# Patient Record
Sex: Male | Born: 1965 | Race: White | Marital: Single | Smoking: Never smoker
Health system: Southern US, Community
[De-identification: ages and names within clinical notes are randomized; demographics above are authoritative.]

---

## 2007-01-02 ENCOUNTER — Ambulatory Visit: Payer: Self-pay | Admitting: Family Medicine

## 2008-09-12 ENCOUNTER — Ambulatory Visit: Payer: Self-pay | Admitting: Internal Medicine

## 2011-03-30 ENCOUNTER — Ambulatory Visit
Admission: RE | Admit: 2011-03-30 | Discharge: 2011-03-30 | Disposition: A | Discharge status: Home / Self Care | Payer: 59 | Source: Ambulatory Visit | Attending: Internal Medicine | Admitting: Internal Medicine

## 2011-03-30 ENCOUNTER — Other Ambulatory Visit: Payer: Self-pay | Admitting: Internal Medicine

## 2011-03-30 DIAGNOSIS — N289 Disorder of kidney and ureter, unspecified: Secondary | ICD-10-CM

## 2011-03-31 ENCOUNTER — Other Ambulatory Visit: Payer: Self-pay

## 2011-04-02 ENCOUNTER — Other Ambulatory Visit: Payer: Self-pay | Admitting: Internal Medicine

## 2011-04-02 DIAGNOSIS — N289 Disorder of kidney and ureter, unspecified: Secondary | ICD-10-CM

## 2011-04-03 ENCOUNTER — Ambulatory Visit
Admission: RE | Admit: 2011-04-03 | Discharge: 2011-04-03 | Disposition: A | Discharge status: Home / Self Care | Payer: 59 | Source: Ambulatory Visit | Attending: Internal Medicine | Admitting: Internal Medicine

## 2011-04-03 DIAGNOSIS — N289 Disorder of kidney and ureter, unspecified: Secondary | ICD-10-CM

## 2011-04-03 MED ORDER — GADOBENATE DIMEGLUMINE 529 MG/ML IV SOLN
17.0000 mL | Freq: Once | INTRAVENOUS | Status: AC | PRN
  Administered 2011-04-03: 17 mL via INTRAVENOUS

## 2011-06-24 ENCOUNTER — Other Ambulatory Visit: Payer: Self-pay | Admitting: Urology

## 2011-08-03 ENCOUNTER — Ambulatory Visit (HOSPITAL_COMMUNITY)
Admission: RE | Admit: 2011-08-03 | Discharge: 2011-08-03 | Disposition: A | Discharge status: Home / Self Care | Payer: 59 | Source: Ambulatory Visit | Attending: Urology | Admitting: Urology

## 2011-08-03 DIAGNOSIS — N289 Disorder of kidney and ureter, unspecified: Secondary | ICD-10-CM | POA: Insufficient documentation

## 2011-08-03 DIAGNOSIS — Q619 Cystic kidney disease, unspecified: Secondary | ICD-10-CM | POA: Insufficient documentation

## 2011-08-03 DIAGNOSIS — R1032 Left lower quadrant pain: Secondary | ICD-10-CM | POA: Insufficient documentation

## 2011-08-03 MED ORDER — GADOBENATE DIMEGLUMINE 529 MG/ML IV SOLN
15.0000 mL | Freq: Once | INTRAVENOUS | Status: AC | PRN
  Administered 2011-08-03: 17 mL via INTRAVENOUS

## 2013-07-27 ENCOUNTER — Other Ambulatory Visit: Payer: Self-pay | Admitting: Urology

## 2013-07-27 DIAGNOSIS — Q6102 Congenital multiple renal cysts: Secondary | ICD-10-CM

## 2013-08-09 ENCOUNTER — Ambulatory Visit (HOSPITAL_COMMUNITY)
Admission: RE | Admit: 2013-08-09 | Discharge: 2013-08-09 | Disposition: A | Discharge status: Home / Self Care | Payer: BC Managed Care – PPO | Source: Ambulatory Visit | Attending: Urology | Admitting: Urology

## 2013-08-09 DIAGNOSIS — N289 Disorder of kidney and ureter, unspecified: Secondary | ICD-10-CM | POA: Insufficient documentation

## 2013-08-09 DIAGNOSIS — R109 Unspecified abdominal pain: Secondary | ICD-10-CM | POA: Insufficient documentation

## 2013-08-09 DIAGNOSIS — Q6102 Congenital multiple renal cysts: Secondary | ICD-10-CM

## 2013-08-09 MED ORDER — GADOBENATE DIMEGLUMINE 529 MG/ML IV SOLN
20.0000 mL | Freq: Once | INTRAVENOUS | Status: AC | PRN
  Administered 2013-08-09: 18 mL via INTRAVENOUS

## 2014-07-30 ENCOUNTER — Other Ambulatory Visit (HOSPITAL_COMMUNITY): Payer: Self-pay | Admitting: Urology

## 2014-07-30 DIAGNOSIS — C649 Malignant neoplasm of unspecified kidney, except renal pelvis: Secondary | ICD-10-CM

## 2014-08-10 ENCOUNTER — Ambulatory Visit (HOSPITAL_COMMUNITY)
Admission: RE | Admit: 2014-08-10 | Discharge: 2014-08-10 | Disposition: A | Discharge status: Home / Self Care | Payer: BLUE CROSS/BLUE SHIELD | Source: Ambulatory Visit | Attending: Urology | Admitting: Urology

## 2014-08-10 DIAGNOSIS — N289 Disorder of kidney and ureter, unspecified: Secondary | ICD-10-CM | POA: Insufficient documentation

## 2014-08-10 DIAGNOSIS — C649 Malignant neoplasm of unspecified kidney, except renal pelvis: Secondary | ICD-10-CM

## 2014-08-10 MED ORDER — GADOBENATE DIMEGLUMINE 529 MG/ML IV SOLN
20.0000 mL | Freq: Once | INTRAVENOUS | Status: AC | PRN
  Administered 2014-08-10: 18 mL via INTRAVENOUS

## 2015-05-27 ENCOUNTER — Encounter: Payer: Self-pay | Admitting: Emergency Medicine

## 2015-05-27 ENCOUNTER — Emergency Department
Admission: EM | Admit: 2015-05-27 | Discharge: 2015-05-27 | Disposition: A | Discharge status: Home / Self Care | Payer: Managed Care, Other (non HMO) | Attending: Emergency Medicine | Admitting: Emergency Medicine

## 2015-05-27 ENCOUNTER — Emergency Department: Payer: Managed Care, Other (non HMO)

## 2015-05-27 DIAGNOSIS — M25511 Pain in right shoulder: Secondary | ICD-10-CM | POA: Insufficient documentation

## 2015-05-27 MED ORDER — TRAMADOL HCL 50 MG PO TABS: 50.0000 mg | ORAL_TABLET | Freq: Four times a day (QID) | ORAL | Status: AC | PRN

## 2015-05-27 MED ORDER — PREDNISONE 20 MG PO TABS
40.0000 mg | ORAL_TABLET | Freq: Once | ORAL | Status: AC
  Administered 2015-05-27: 40 mg via ORAL
  Filled 2015-05-27: qty 2

## 2015-05-27 MED ORDER — PREDNISONE 20 MG PO TABS: 40.0000 mg | ORAL_TABLET | Freq: Every day | ORAL | Status: AC

## 2015-05-27 MED ORDER — HYDROCODONE-ACETAMINOPHEN 5-325 MG PO TABS: 1.0000 | ORAL_TABLET | ORAL | Status: AC | PRN

## 2015-05-27 MED ORDER — KETOROLAC TROMETHAMINE 60 MG/2ML IM SOLN
60.0000 mg | Freq: Once | INTRAMUSCULAR | Status: AC
  Administered 2015-05-27: 60 mg via INTRAMUSCULAR
  Filled 2015-05-27: qty 2

## 2015-05-27 NOTE — ED Notes (Addendum)
Pt c/o right shoulder pain for several days; worsening; stabbing pain; says he is unable to lift his arm due to the pain; pt denies injury; pain started Friday and has worsened over the weekend; pt says pain is keeping him awake tonight

## 2015-05-27 NOTE — ED Provider Notes (Signed)
[facility] Emergency Department Provider Note  Time seen: 4:43 AM  I have reviewed the triage vital signs and the nursing notes.   HISTORY  Chief Complaint Shoulder Pain    HPI William Schultz is a 50 y.o. male with no past medical history who presents to the emergency department right shoulder pain. According to the patient for the past 2 days he has been experiencing right shoulder pain. States this started Friday morning when he woke up. States he had a dull aching pain in the right shoulder as if he slept on it wrong or pulled it. Patient states over the past 2 days it has worsened, and is now painful to move the right shoulder. Denies any history of arthritis or gout. Denies any fever, nausea or vomiting. Denies any known trauma to the shoulder.     History reviewed. No pertinent past medical history.  There are no active problems to display for this patient.   History reviewed. No pertinent past surgical history.  No current outpatient prescriptions on file.  Allergies Review of patient's allergies indicates no known allergies.  History reviewed. No pertinent family history.  Social History Social History  Substance Use Topics  . Smoking status: Never Smoker   . Smokeless tobacco: None  . Alcohol Use: Yes     Comment: occasional    Review of Systems Constitutional: Negative for fever. Cardiovascular: Negative for chest pain. Respiratory: Negative for shortness of breath. Gastrointestinal: Negative for abdominal pain.  negative for vomiting. Musculoskeletal: Positive for right shoulder pain. Skin: Negative for rash. Neurological: Negative for headache 10-point ROS otherwise negative.  ____________________________________________   PHYSICAL EXAM:  VITAL SIGNS: ED Triage Vitals  Enc Vitals Group     BP 05/27/15 0224 179/102 mmHg     Pulse Rate 05/27/15 0224 89     Resp 05/27/15 0224 18     Temp 05/27/15 0224 98.1 F (36.7 C)      Temp Source 05/27/15 0224 Oral     SpO2 05/27/15 0224 97 %     Weight 05/27/15 0224 200 lb (90.719 kg)     Height 05/27/15 0224 5\' 9"  (1.753 m)     Head Cir --      Peak Flow --      Pain Score 05/27/15 0221 7     Pain Loc --      Pain Edu? --      Excl. in [place]? --     Constitutional: Alert and oriented. Well appearing and in no distress. Eyes: Normal exam ENT   Head: Normocephalic and atraumatic   Mouth/Throat: Mucous membranes are moist. Cardiovascular: Normal rate, regular rhythm. No murmur Respiratory: Normal respiratory effort without tachypnea nor retractions. Breath sounds are clear Gastrointestinal: Soft and nontender. No distention.   Musculoskeletal: Moderate tenderness to palpation of the right shoulder. Moderate tenderness with active and passive range of motion of the shoulder. Neurovascular intact distally, 2+ radial pulse. No skin color changes or erythema. No appreciable edema. Neurologic:  Normal speech and language. No gross focal neurologic deficits Skin:  Skin is warm, dry and intact.  Psychiatric: Mood and affect are normal.   ____________________________________________   RADIOLOGY  X-ray negative  ____________________________________________    INITIAL IMPRESSION / ASSESSMENT AND PLAN / ED COURSE  Pertinent labs & imaging results that were available during my care of the patient were reviewed by me and considered in my medical decision making (see chart for details).  X-rays negative. Patient's shoulder is  tender to palpation and range of motion, but no overlying skin color changes, no appreciable swelling, denies fever or vomiting. Do not suspect septic joint. Likely musculoskeletal pain or purse sinus. I discussed with the patient if he develops any fever or worsening pain he is to return to the emergency department for further evaluation, otherwise the patient will follow up with orthopedics in 1 week if symptoms remain. I will place  patient on a burst course of steroids as well as pain medication to be taken as needed. Patient is agreeable to this plan. We will dose IM Toradol in the emergency department along with his first dose of steroids. Patient seems to be a reliable patient, and agrees to return for any worsening symptoms or fever.  ____________________________________________   FINAL CLINICAL IMPRESSION(S) / ED DIAGNOSES  Right shoulder pain   Harvest Dark, MD 05/27/15 [phone]

## 2015-05-27 NOTE — Discharge Instructions (Signed)

## 2016-04-08 ENCOUNTER — Other Ambulatory Visit (HOSPITAL_COMMUNITY): Payer: Self-pay | Admitting: Urology

## 2016-04-08 DIAGNOSIS — C649 Malignant neoplasm of unspecified kidney, except renal pelvis: Secondary | ICD-10-CM

## 2016-04-21 ENCOUNTER — Ambulatory Visit (HOSPITAL_COMMUNITY)
Admission: RE | Admit: 2016-04-21 | Discharge: 2016-04-21 | Disposition: A | Discharge status: Home / Self Care | Payer: Managed Care, Other (non HMO) | Source: Ambulatory Visit | Attending: Urology | Admitting: Urology

## 2016-04-21 DIAGNOSIS — N281 Cyst of kidney, acquired: Secondary | ICD-10-CM | POA: Diagnosis not present

## 2016-04-21 DIAGNOSIS — K760 Fatty (change of) liver, not elsewhere classified: Secondary | ICD-10-CM | POA: Insufficient documentation

## 2016-04-21 DIAGNOSIS — C649 Malignant neoplasm of unspecified kidney, except renal pelvis: Secondary | ICD-10-CM | POA: Insufficient documentation

## 2016-04-21 MED ORDER — GADOBENATE DIMEGLUMINE 529 MG/ML IV SOLN
20.0000 mL | Freq: Once | INTRAVENOUS | Status: AC | PRN
  Administered 2016-04-21: 19 mL via INTRAVENOUS

## 2017-05-19 IMAGING — CR DG SHOULDER 2+V*R*
3 series · 3 of 3 positions shown · non-contrast
Comparison: None.

CLINICAL DATA: RIGHT shoulder pain without injury for 3 days.

EXAM:
RIGHT SHOULDER - 2+ VIEW

[shoulder grashey]
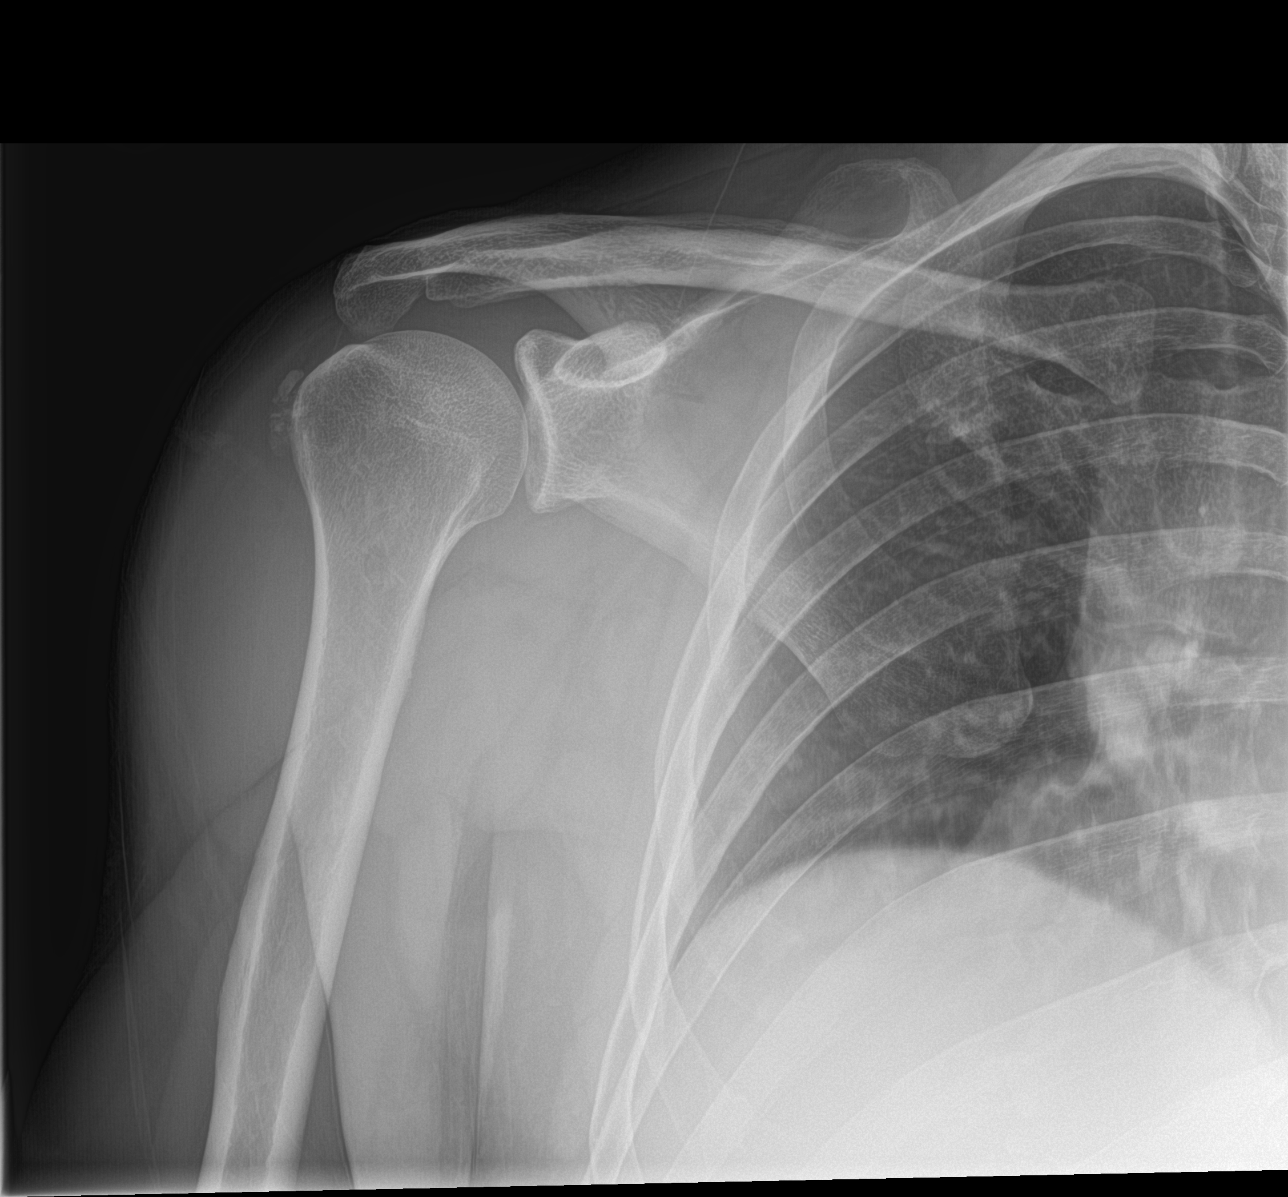

[shoulder y view]
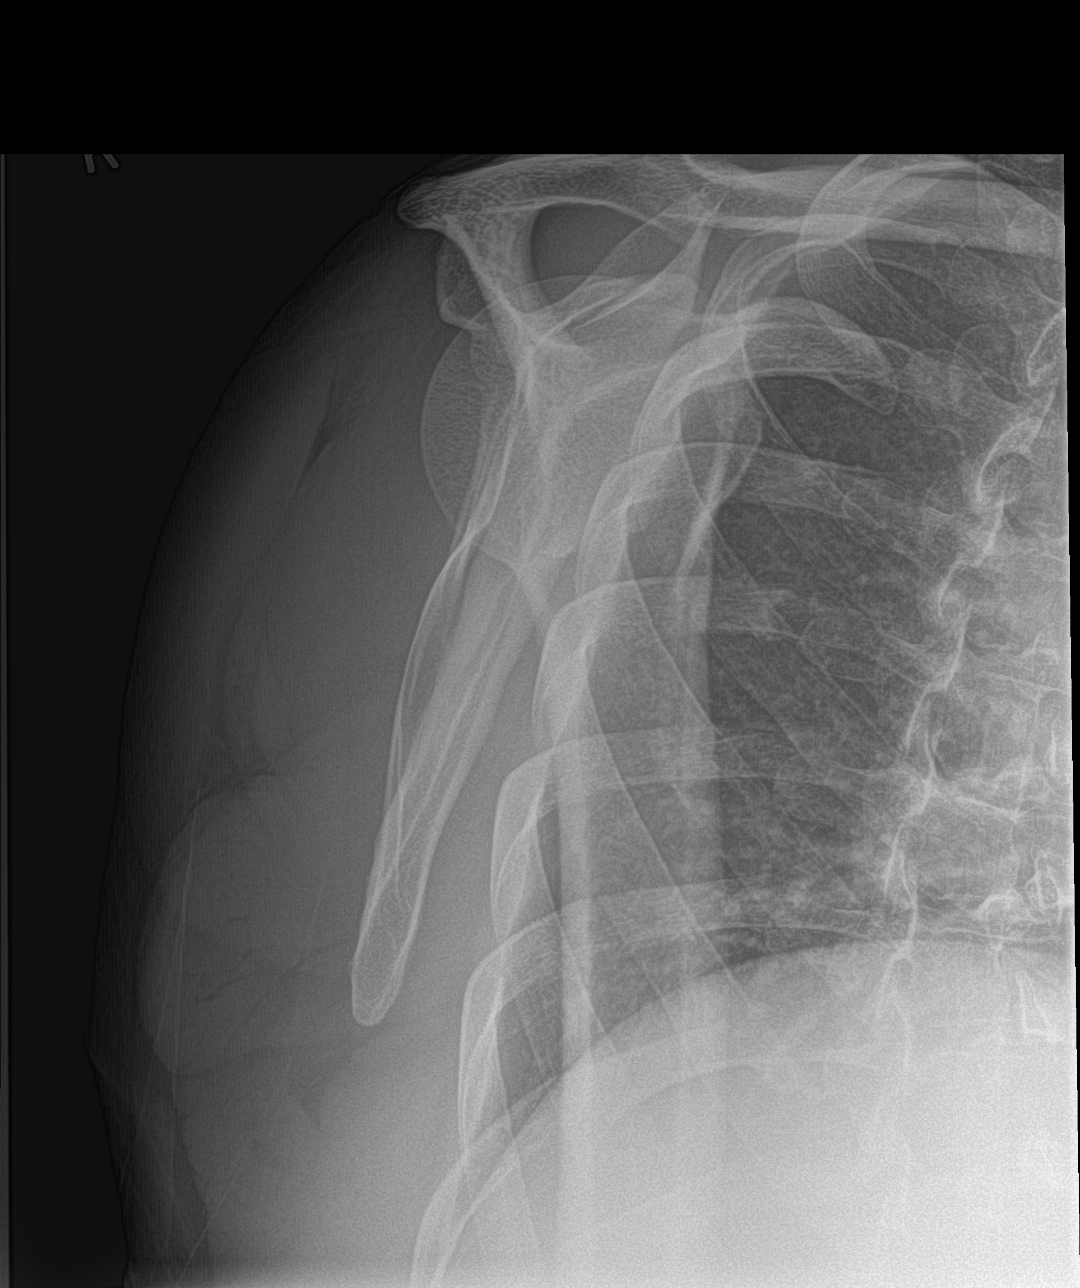

[shoulder axillary]
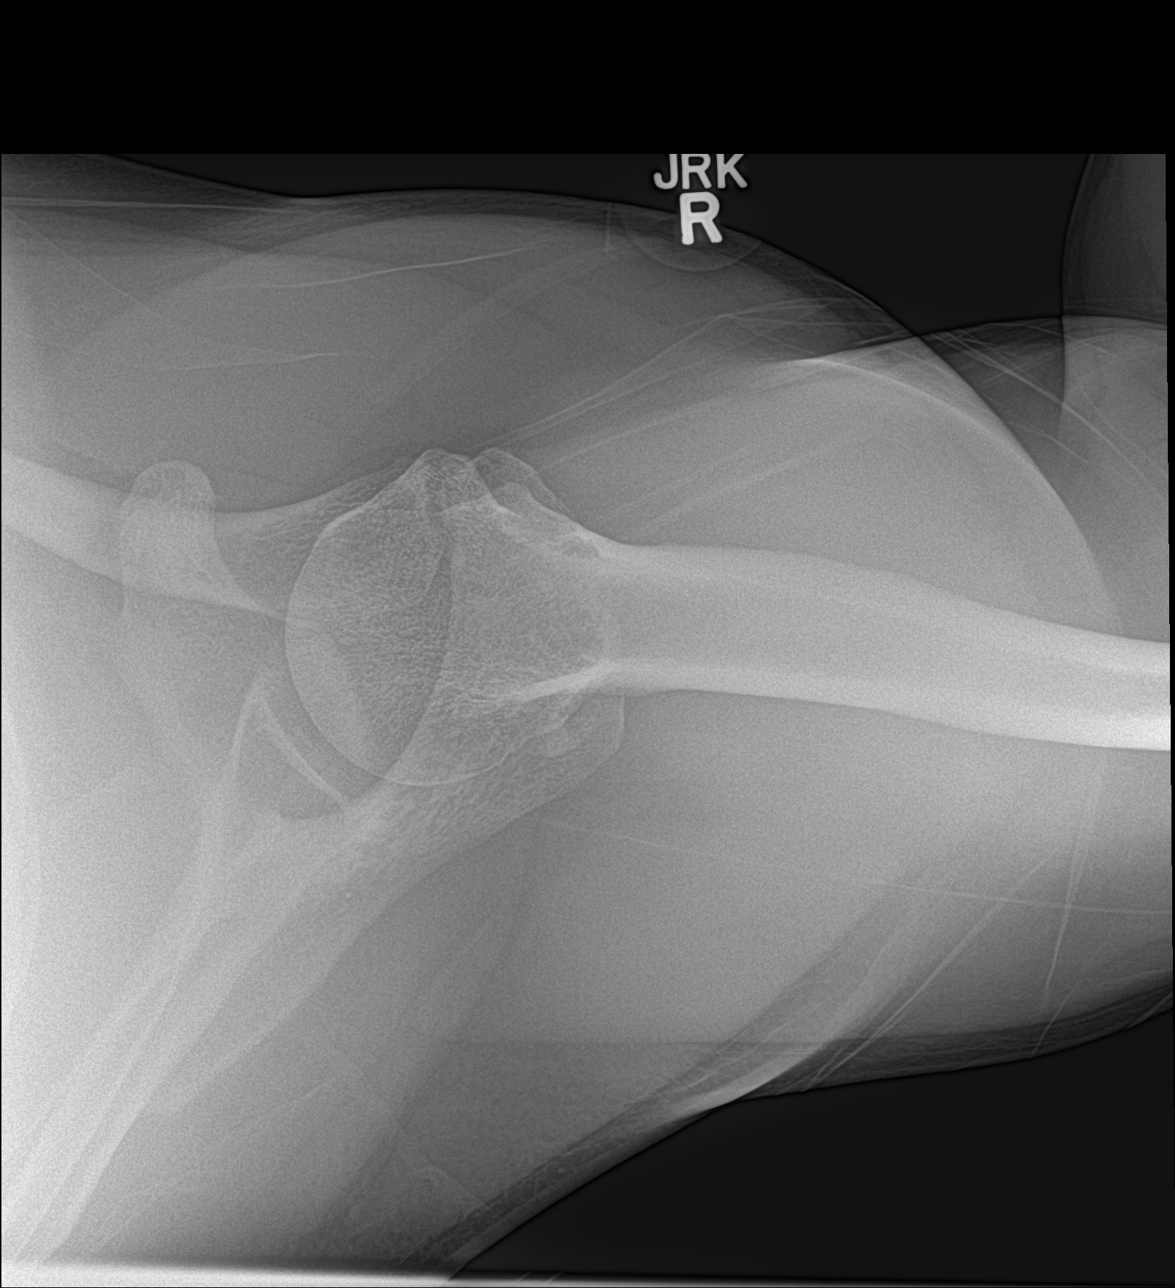

[3 of 3 positions shown; findings below may reference images not displayed]

FINDINGS: There is no evidence of fracture or dislocation. Corticated
calcifications lateral to the humeral head at the level of the
supraspinatus insertion. There is no evidence of arthropathy or
other focal bone abnormality. Soft tissues are unremarkable.
IMPRESSION: Calcific tendinopathy without acute osseous process.

## 2018-06-13 ENCOUNTER — Other Ambulatory Visit: Payer: Self-pay | Admitting: Urology

## 2018-06-13 DIAGNOSIS — D49512 Neoplasm of unspecified behavior of left kidney: Secondary | ICD-10-CM

## 2018-06-27 ENCOUNTER — Ambulatory Visit (HOSPITAL_COMMUNITY)
Admission: RE | Admit: 2018-06-27 | Discharge: 2018-06-27 | Disposition: A | Discharge status: Home / Self Care | Payer: BC Managed Care – PPO | Source: Ambulatory Visit | Attending: Urology | Admitting: Urology

## 2018-06-27 ENCOUNTER — Other Ambulatory Visit: Payer: Self-pay

## 2018-06-27 DIAGNOSIS — D49512 Neoplasm of unspecified behavior of left kidney: Secondary | ICD-10-CM | POA: Diagnosis not present

## 2018-06-27 MED ORDER — GADOBUTROL 1 MMOL/ML IV SOLN
9.0000 mL | Freq: Once | INTRAVENOUS | Status: AC | PRN
Start: 2018-06-27 — End: 2018-06-27
  Administered 2018-06-27: 9 mL via INTRAVENOUS

## 2020-06-04 ENCOUNTER — Other Ambulatory Visit: Payer: Self-pay | Admitting: Urology

## 2020-06-04 DIAGNOSIS — D49512 Neoplasm of unspecified behavior of left kidney: Secondary | ICD-10-CM

## 2020-06-19 ENCOUNTER — Other Ambulatory Visit: Payer: Self-pay

## 2020-06-19 ENCOUNTER — Ambulatory Visit (HOSPITAL_COMMUNITY)
Admission: RE | Admit: 2020-06-19 | Discharge: 2020-06-19 | Disposition: A | Discharge status: Home / Self Care | Payer: BC Managed Care – PPO | Source: Ambulatory Visit | Attending: Urology | Admitting: Urology

## 2020-06-19 DIAGNOSIS — D49512 Neoplasm of unspecified behavior of left kidney: Secondary | ICD-10-CM | POA: Insufficient documentation

## 2020-06-19 IMAGING — MR MRI ABDOMEN WITH AND WITHOUT CONTRAST
11 of 19 series · 22 of 48 positions shown · IV contrast (gadavist)
Comparison: 04/21/2016 and 08/10/2014

CLINICAL DATA: Follow-up left renal mass.

EXAM:
MRI ABDOMEN WITHOUT AND WITH CONTRAST
TECHNIQUE: Multiplanar multisequence MR imaging of the abdomen was performed
both before and after the administration of intravenous contrast.
CONTRAST:  9 mL Gadavist

[Series 3: DWI b500 · axial · 6.0mm · 1.72mm/px · z∈[-41,+287]mm · 2 of 86 slices shown (1 of 2)]
[im 1/86]
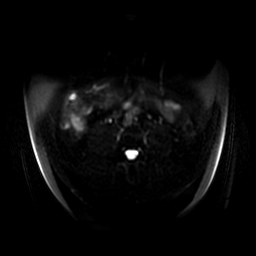
[im 86/86]
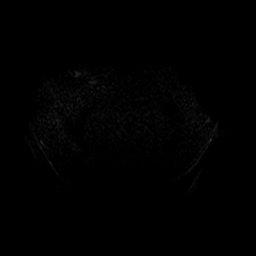

[Series 5: DWI b500 · axial · 6.0mm · 1.72mm/px · z∈[-64,+287]mm · 2 of 92 slices shown (2 of 2)]
[im 1/92]
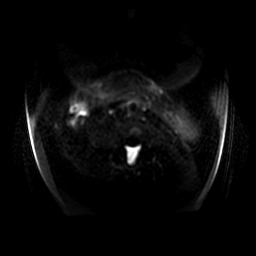
[im 92/92]
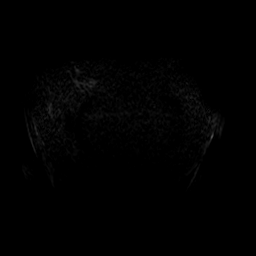

[Series 6: T2 fat-sat · axial · 5.0mm · 0.86mm/px · 1 of 62 slices shown]
[im 1/62]
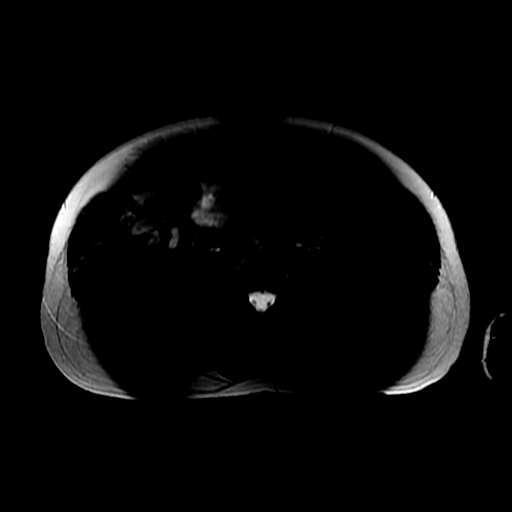

[Series 7: T2 · axial · 5.0mm · 0.86mm/px · z∈[-37,+268]mm · 2 of 62 slices shown (1 of 2)]
[im 1/62]
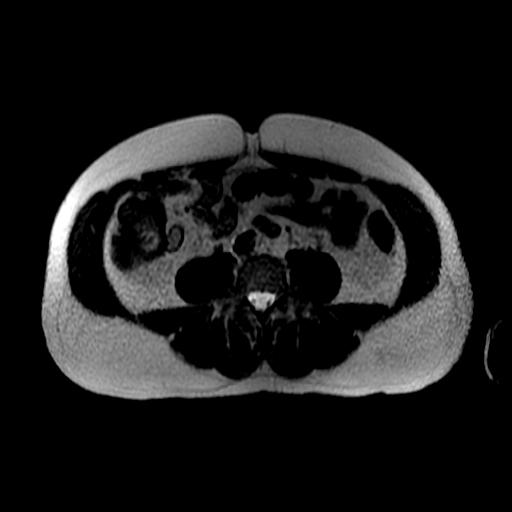
[im 62/62]
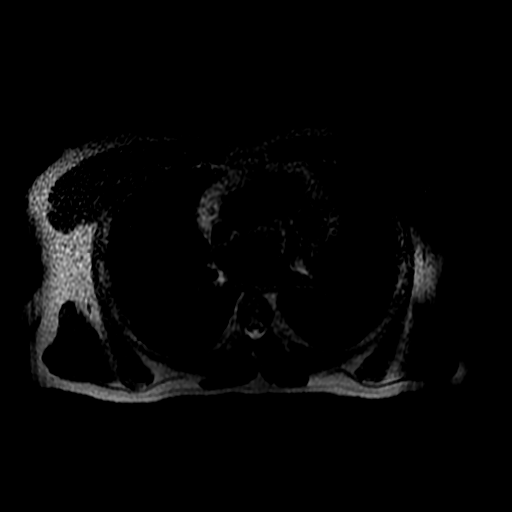

[Series 8: T2 · coronal · 5.0mm · 0.82mm/px · 1 of 48 slices shown (2 of 2)]
[im 1/48]
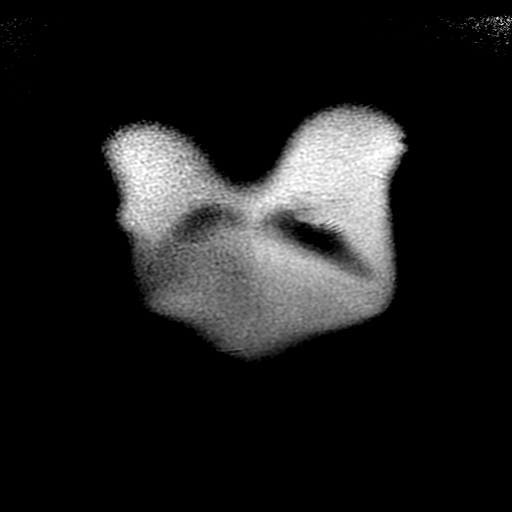

[Series 9: bSSFP · axial · 5.0mm · 0.86mm/px · z∈[-37,+268]mm · 2 of 62 slices shown]
[im 1/62]
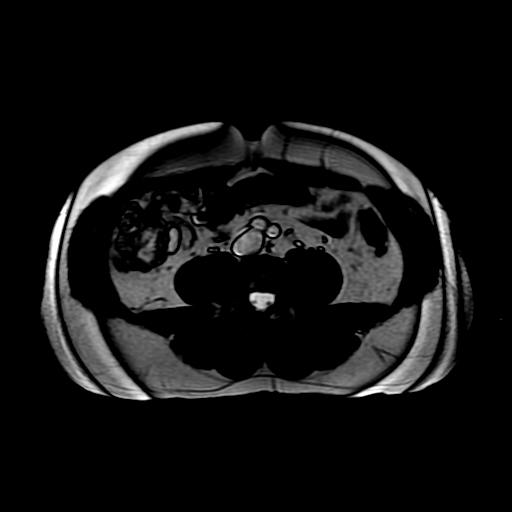
[im 62/62]
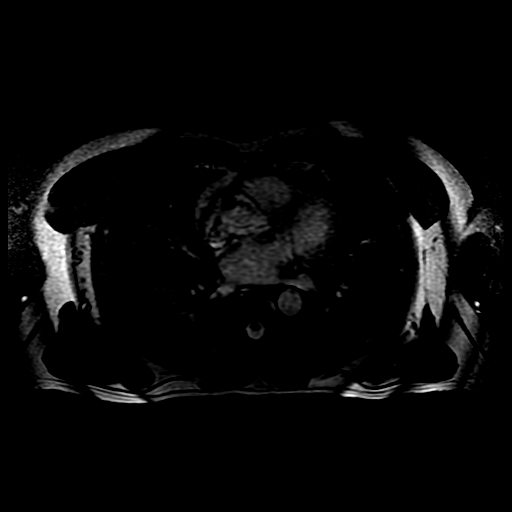

[Series 10: ax dualecho bh · axial · 5.0mm · 0.86mm/px · z∈[-37,+268]mm · 4 of 124 slices shown]
[im 1/124]
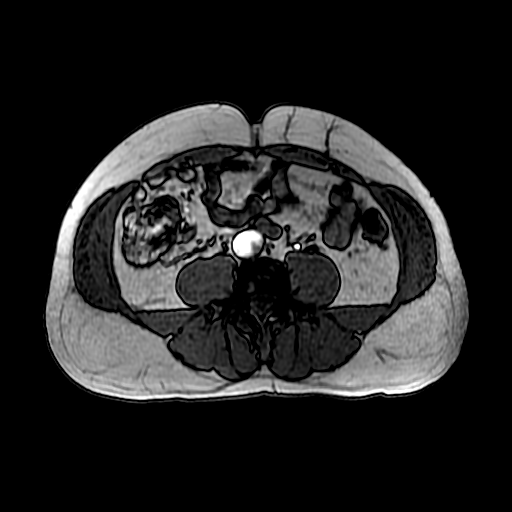
[im 42/124]
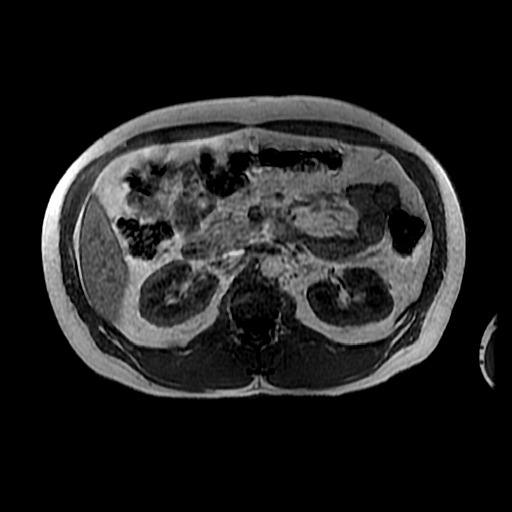
[im 83/124]
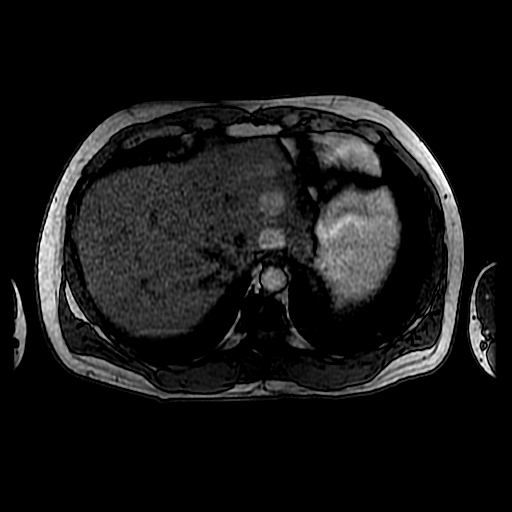
[im 124/124]
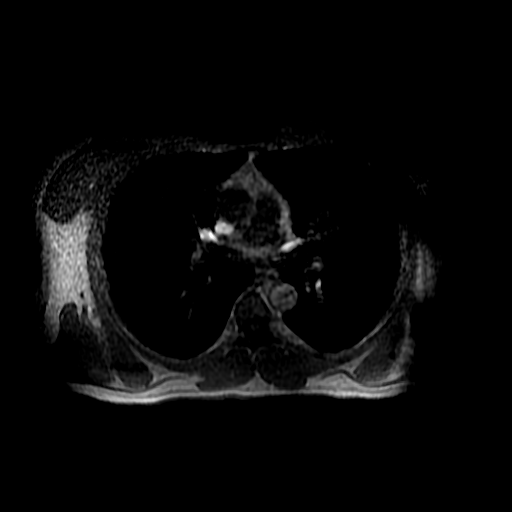

[Series 300: DWI · axial · 6.0mm · 1.72mm/px · 1 of 43 slices shown]
[im 1/43]
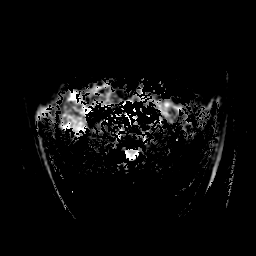

[Series 1100: T1 dynamic · axial · 6.0mm · 0.82mm/px · z∈[-33,+252]mm · 3 of 96 slices shown (1 of 3)]
[im 1/96]
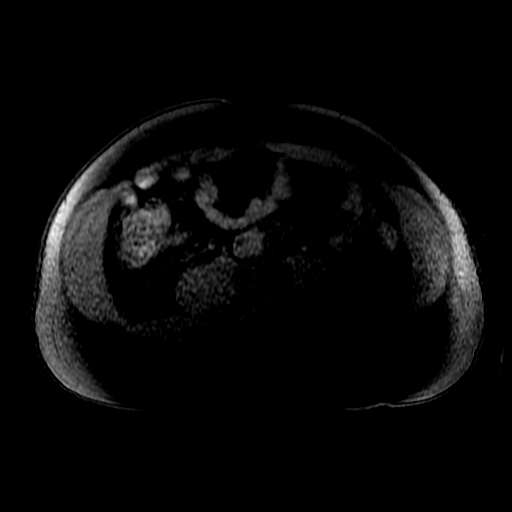
[im 48/96]
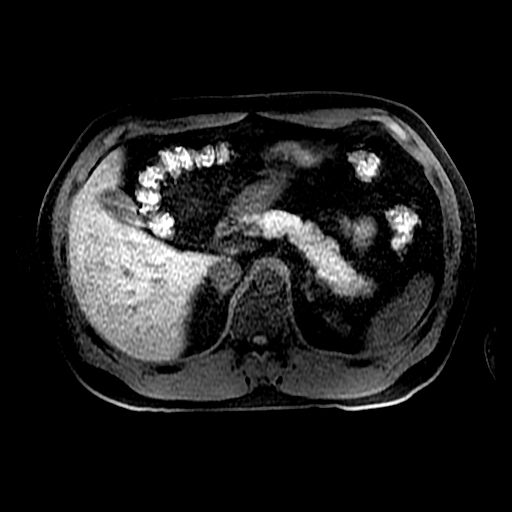
[im 96/96]
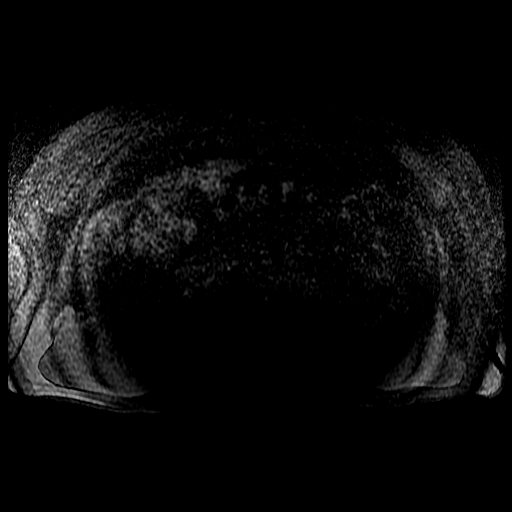

[Series 1101: T1 dynamic · axial · 6.0mm · 0.82mm/px · z∈[-33,+252]mm · 3 of 96 slices shown (2 of 3)]
[im 1/96]
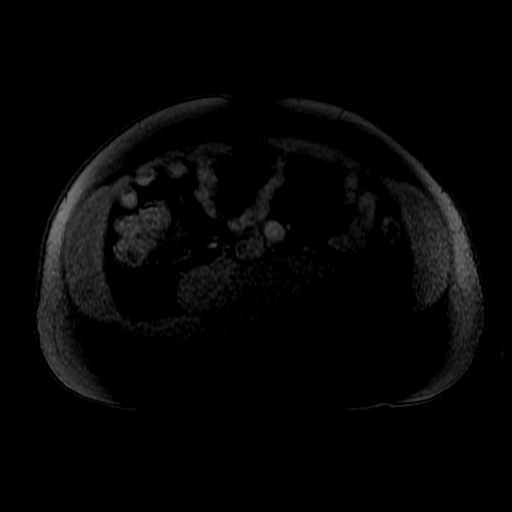
[im 48/96]
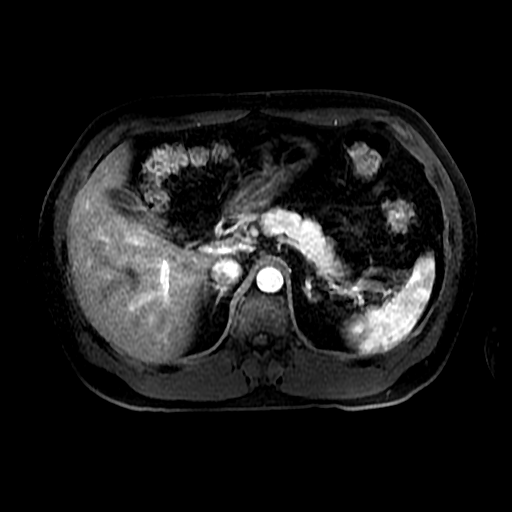
[im 96/96]
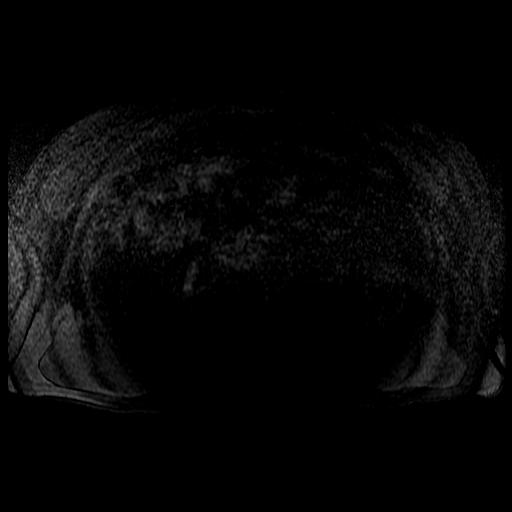

[Series 1102: T1 dynamic · axial · 6.0mm · 0.82mm/px · 1 of 96 slices shown (3 of 3)]
[im 1/96]
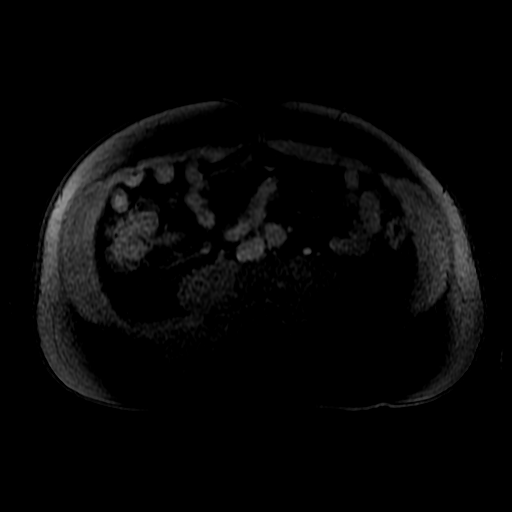

[22 of 48 positions shown; findings below may reference images not displayed]

FINDINGS: Lower chest: No acute findings.

Hepatobiliary: No hepatic masses identified. Gallbladder is
unremarkable. No evidence of biliary ductal dilatation.

Pancreas:  No mass or inflammatory changes.

Spleen:  Within normal limits in size and appearance.

Adrenals/Urinary Tract: Normal adrenal glands. The right kidney is
normal in appearance. Simple appearing cyst in the medial lower pole
of the left kidney is stable. A 1.2 cm lesion in the medial midpole
of the left kidney shows mild enhancement on subtraction imaging,
and shows slight decrease in size from 1.6 cm on prior exams. No new
or enlarging renal lesions identified. No evidence of
hydronephrosis.

Stomach/Bowel: Visualized portion unremarkable.

Vascular/Lymphatic: No pathologically enlarged lymph nodes
identified. No abdominal aortic aneurysm.

Other:  None.

Musculoskeletal:  No suspicious bone lesions identified.
IMPRESSION: 1.2 cm hypoenhancing lesion in medial upper pole of left kidney
shows slight decrease in size compared to previous exams. An
indolent papillary renal cell carcinoma cannot be excluded.
Recommend continued imaging follow-up by MRI in 1-2 years.

## 2020-06-19 MED ORDER — GADOBUTROL 1 MMOL/ML IV SOLN
9.0000 mL | Freq: Once | INTRAVENOUS | Status: AC | PRN
  Administered 2020-06-19: 9 mL via INTRAVENOUS

## 2022-05-18 ENCOUNTER — Other Ambulatory Visit: Payer: Self-pay | Admitting: Urology

## 2022-05-18 DIAGNOSIS — D49512 Neoplasm of unspecified behavior of left kidney: Secondary | ICD-10-CM

## 2022-06-30 ENCOUNTER — Ambulatory Visit
Admission: RE | Admit: 2022-06-30 | Discharge: 2022-06-30 | Disposition: A | Discharge status: Home / Self Care | Payer: Managed Care, Other (non HMO) | Source: Ambulatory Visit | Attending: Urology | Admitting: Urology

## 2022-06-30 DIAGNOSIS — D49512 Neoplasm of unspecified behavior of left kidney: Secondary | ICD-10-CM

## 2022-06-30 MED ORDER — GADOPICLENOL 0.5 MMOL/ML IV SOLN
9.0000 mL | Freq: Once | INTRAVENOUS | Status: AC | PRN
  Administered 2022-06-30: 9 mL via INTRAVENOUS
